# Patient Record
Sex: Female | Born: 1955 | Race: White | Hispanic: No | Marital: Married | Smoking: Former smoker
Health system: Southern US, Community
[De-identification: ages and names within clinical notes are randomized; demographics above are authoritative.]

## PROBLEM LIST (undated history)

## (undated) DIAGNOSIS — T7840XA Allergy, unspecified, initial encounter: Secondary | ICD-10-CM

## (undated) DIAGNOSIS — IMO0001 Reserved for inherently not codable concepts without codable children: Secondary | ICD-10-CM

## (undated) DIAGNOSIS — M810 Age-related osteoporosis without current pathological fracture: Secondary | ICD-10-CM

## (undated) DIAGNOSIS — K5790 Diverticulosis of intestine, part unspecified, without perforation or abscess without bleeding: Secondary | ICD-10-CM

## (undated) DIAGNOSIS — E2839 Other primary ovarian failure: Secondary | ICD-10-CM

## (undated) DIAGNOSIS — N3281 Overactive bladder: Secondary | ICD-10-CM

## (undated) DIAGNOSIS — M419 Scoliosis, unspecified: Secondary | ICD-10-CM

## (undated) DIAGNOSIS — R896 Abnormal cytological findings in specimens from other organs, systems and tissues: Secondary | ICD-10-CM

## (undated) DIAGNOSIS — K635 Polyp of colon: Secondary | ICD-10-CM

## (undated) DIAGNOSIS — R351 Nocturia: Secondary | ICD-10-CM

## (undated) DIAGNOSIS — M8580 Other specified disorders of bone density and structure, unspecified site: Secondary | ICD-10-CM

## (undated) DIAGNOSIS — I10 Essential (primary) hypertension: Secondary | ICD-10-CM

## (undated) HISTORY — DX: Other specified disorders of bone density and structure, unspecified site: M85.80

## (undated) HISTORY — DX: Other primary ovarian failure: E28.39

## (undated) HISTORY — DX: Overactive bladder: N32.81

## (undated) HISTORY — DX: Polyp of colon: K63.5

## (undated) HISTORY — DX: Nocturia: R35.1

## (undated) HISTORY — DX: Reserved for inherently not codable concepts without codable children: IMO0001

## (undated) HISTORY — DX: Diverticulosis of intestine, part unspecified, without perforation or abscess without bleeding: K57.90

## (undated) HISTORY — DX: Scoliosis, unspecified: M41.9

## (undated) HISTORY — PX: WISDOM TOOTH EXTRACTION: SHX21

## (undated) HISTORY — DX: Age-related osteoporosis without current pathological fracture: M81.0

## (undated) HISTORY — DX: Allergy, unspecified, initial encounter: T78.40XA

## (undated) HISTORY — DX: Abnormal cytological findings in specimens from other organs, systems and tissues: R89.6

## (undated) HISTORY — PX: COLONOSCOPY: SHX174

## (undated) HISTORY — DX: Essential (primary) hypertension: I10

---

## 1999-12-10 ENCOUNTER — Other Ambulatory Visit: Admission: RE | Admit: 1999-12-10 | Discharge: 1999-12-10 | Payer: Self-pay | Admitting: Gynecology

## 2000-04-12 ENCOUNTER — Emergency Department (HOSPITAL_COMMUNITY): Admission: EM | Admit: 2000-04-12 | Discharge: 2000-04-12 | Payer: Self-pay | Admitting: *Deleted

## 2000-04-24 ENCOUNTER — Emergency Department (HOSPITAL_COMMUNITY): Admission: EM | Admit: 2000-04-24 | Discharge: 2000-04-24 | Payer: Self-pay | Admitting: Emergency Medicine

## 2001-01-07 ENCOUNTER — Other Ambulatory Visit: Admission: RE | Admit: 2001-01-07 | Discharge: 2001-01-07 | Payer: Self-pay | Admitting: Internal Medicine

## 2002-02-12 ENCOUNTER — Other Ambulatory Visit: Admission: RE | Admit: 2002-02-12 | Discharge: 2002-02-12 | Payer: Self-pay | Admitting: Internal Medicine

## 2005-03-28 ENCOUNTER — Other Ambulatory Visit: Admission: RE | Admit: 2005-03-28 | Discharge: 2005-03-28 | Payer: Self-pay | Admitting: Internal Medicine

## 2005-06-21 ENCOUNTER — Ambulatory Visit: Payer: Self-pay | Admitting: Gastroenterology

## 2005-07-05 ENCOUNTER — Ambulatory Visit: Payer: Self-pay | Admitting: Gastroenterology

## 2005-09-13 ENCOUNTER — Ambulatory Visit (HOSPITAL_COMMUNITY): Admission: RE | Admit: 2005-09-13 | Discharge: 2005-09-13 | Payer: Self-pay | Admitting: Internal Medicine

## 2008-02-11 ENCOUNTER — Encounter: Admission: RE | Admit: 2008-02-11 | Discharge: 2008-02-11 | Payer: Self-pay | Admitting: Obstetrics and Gynecology

## 2010-06-13 ENCOUNTER — Encounter (INDEPENDENT_AMBULATORY_CARE_PROVIDER_SITE_OTHER): Payer: Self-pay | Admitting: *Deleted

## 2010-12-11 NOTE — Letter (Signed)
Summary: Colonoscopy Letter  [facility]  [address] [place], [place] [address]   Phone: [phone]  Fax: [phone]      June 13, 2010 MRN: [id number]   Tanya Kennedy [address] [place], [place]  [address]   Dear Ms. Hollan,   According to your medical record, it is time for you to schedule a Colonoscopy. The American Cancer Society recommends this procedure as a method to detect early colon cancer. Patients with a family history of colon cancer, or a personal history of colon polyps or inflammatory bowel disease are at increased risk.  This letter has beeen generated based on the recommendations made at the time of your procedure. If you feel that in your particular situation this may no longer apply, please contact our office.  Please call our office at [phone] to schedule this appointment or to update your records at your earliest convenience.  Thank you for cooperating with [place] to provide you with the very best care possible.   Sincerely,    Barbette Hair. [facility] [phone]

## 2012-03-10 ENCOUNTER — Telehealth: Payer: Self-pay | Admitting: Obstetrics and Gynecology

## 2012-03-10 NOTE — Telephone Encounter (Signed)
Tc to pt per telephone call. Told pt last pap smear 5/231/1-wnl. Due to new pap smear guidelines, next pap smear due 2014. Pt voices understanding.

## 2012-03-10 NOTE — Telephone Encounter (Signed)
Routed to chandra 

## 2012-04-16 ENCOUNTER — Ambulatory Visit: Payer: Self-pay | Admitting: Obstetrics and Gynecology

## 2012-04-17 ENCOUNTER — Other Ambulatory Visit: Payer: Self-pay

## 2012-04-20 ENCOUNTER — Encounter: Payer: Self-pay | Admitting: Obstetrics and Gynecology

## 2012-04-20 ENCOUNTER — Ambulatory Visit (INDEPENDENT_AMBULATORY_CARE_PROVIDER_SITE_OTHER): Payer: Commercial Managed Care - PPO | Admitting: Obstetrics and Gynecology

## 2012-04-20 ENCOUNTER — Other Ambulatory Visit (INDEPENDENT_AMBULATORY_CARE_PROVIDER_SITE_OTHER): Payer: Commercial Managed Care - PPO

## 2012-04-20 VITALS — BP 108/64 | Ht 64.0 in | Wt 133.0 lb

## 2012-04-20 DIAGNOSIS — M8580 Other specified disorders of bone density and structure, unspecified site: Secondary | ICD-10-CM

## 2012-04-20 DIAGNOSIS — R351 Nocturia: Secondary | ICD-10-CM | POA: Insufficient documentation

## 2012-04-20 DIAGNOSIS — Z0000 Encounter for general adult medical examination without abnormal findings: Secondary | ICD-10-CM

## 2012-04-20 DIAGNOSIS — N3281 Overactive bladder: Secondary | ICD-10-CM | POA: Insufficient documentation

## 2012-04-20 DIAGNOSIS — M899 Disorder of bone, unspecified: Secondary | ICD-10-CM

## 2012-04-20 DIAGNOSIS — M810 Age-related osteoporosis without current pathological fracture: Secondary | ICD-10-CM

## 2012-04-20 NOTE — Progress Notes (Signed)
Last Pap: 04/10/2010 WNL: Yes Regular Periods:yes Contraception: None   Monthly Breast exam:yes  Tetanus<95yrs:yes Nl.Bladder Function:yes Daily BMs:yes Healthy Diet:yes Calcium:yes Mammogram:yes 2012 Exercise:yes Seatbelt: yes Abuse at home: no Stressful work:no Sigmoid-colonoscopy: 2 years ago WNL Bone Density: Yes 04/10/2010  Subjective:    Tanya Kennedy is a 56 y.o. female G2P2 who presents for annual exam.  The patient has no complaints today.   The following portions of the patient's history were reviewed and updated as appropriate: allergies, current medications, past family history, past medical history, past social history, past surgical history and problem list.  Review of Systems Pertinent items are noted in HPI. Gastrointestinal:No change in bowel habits, no abdominal pain, no rectal bleeding Genitourinary:negative for dysuria, frequency, hematuria, nocturia and urinary incontinence    Objective:     BP 108/64  Ht 5\' 4"  (1.626 m)  Wt 133 lb (60.328 kg)  BMI 22.83 kg/m2  Weight:  Wt Readings from Last 1 Encounters:  04/20/12 133 lb (60.328 kg)     BMI: Body mass index is 22.83 kg/(m^2). General Appearance: Alert, appropriate appearance for age. No acute distress HEENT: Grossly normal Neck / Thyroid: Supple, no masses, nodes or enlargement Lungs: clear to auscultation bilaterally Back: No CVA tenderness Breast Exam: No masses or nodes.No dimpling, nipple retraction or discharge. Cardiovascular: Regular rate and rhythm. S1, S2, no murmur Gastrointestinal: Soft, non-tender, no masses or organomegaly Pelvic Exam: External genitalia: normal general appearance Vaginal: atrophic mucosa Cervix: normal appearance Adnexa: non palpable Uterus: normal single, nontender Rectovaginal: normal rectal, no masses Lymphatic Exam: Non-palpable nodes in neck, clavicular, axillary, or inguinal regions  Skin: no rash or abnormalities Neurologic: Normal gait and speech, no  tremor  Psychiatric: Alert and oriented, appropriate affect.    Urinalysis:Not done Bone density: Spine: T score -2.9    Femoral neck: T score -1.9    Total hip: T score -1.9    FRAX score:Maj. 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 Diagnosis explained in detail, including differential. Dietary diary. Discussed healthy lifestyle modifications. spine series to rule out occult spinal fracture  I discussed the possibility that medication would be advisable if indeed there is any evidence of occult fracture. Vitamin D level 30 minutes of weightbearing exercise at least 3 times per week Resistance training at least 2 times per week Followup based on x-ray results

## 2012-04-22 ENCOUNTER — Telehealth: Payer: Self-pay | Admitting: Obstetrics and Gynecology

## 2012-04-22 DIAGNOSIS — M810 Age-related osteoporosis without current pathological fracture: Secondary | ICD-10-CM

## 2012-04-22 NOTE — Telephone Encounter (Signed)
Chandra/received and addressed

## 2012-04-22 NOTE — Telephone Encounter (Signed)
Tc to pt per telephone call. Told pt Vit D results not back yet and will call once reviewed by vph. Pt voices understanding. Pt states,"was suppose to have xrays of spine sched per DEXA results". Told pt will consult with vph per recs. Pt agrees.

## 2012-04-23 LAB — VITAMIN D 1,25 DIHYDROXY: Vitamin D 1, 25 (OH)2 Total: 43 pg/mL (ref 18–72)

## 2012-04-23 NOTE — Telephone Encounter (Signed)
Consulted with vph, pt is to have xrays of the thoracic and lumbar spine to r/o occult fracture. Pt aware. Pt will have xrays done at earliest convienence.

## 2012-04-27 ENCOUNTER — Ambulatory Visit
Admission: RE | Admit: 2012-04-27 | Discharge: 2012-04-27 | Disposition: A | Discharge status: Home / Self Care | Payer: Commercial Managed Care - PPO | Source: Ambulatory Visit | Attending: Obstetrics and Gynecology | Admitting: Obstetrics and Gynecology

## 2012-04-27 ENCOUNTER — Telehealth: Payer: Self-pay

## 2012-04-27 DIAGNOSIS — M810 Age-related osteoporosis without current pathological fracture: Secondary | ICD-10-CM

## 2012-04-27 NOTE — Telephone Encounter (Signed)
Tc to pt per xray results. Told pt thoracic and lumbar spine xrays= no fracuture per ep. Told pt will follow up with vh when she returns to office on 05/07/12 rgdg POC. Pt voices understanding.

## 2012-04-29 ENCOUNTER — Other Ambulatory Visit: Payer: Self-pay

## 2012-05-05 ENCOUNTER — Telehealth: Payer: Self-pay

## 2012-05-05 NOTE — Telephone Encounter (Signed)
Tc to pt. Pt informed needs an appt to discuss options rgdg tx for osteoporosis. Pt states," at this time would prefer not to start any meds and would rather try natural remedies(ie increase exercise,etc). Pt wants vph to be aware of this before she schedules an appt. Informed pt it would be best to still sched an appt with vph to discuss concerns;however pt opts to consult with vph rgdg this before an appt is made. Will consult with vph per recs and call back. Pt agrees.

## 2012-05-08 NOTE — Telephone Encounter (Signed)
Lm on vm for pt to cb per vph recs rgdg tx for osteoporosis. Pt needs an appt to discuss options.

## 2012-05-18 ENCOUNTER — Telehealth: Payer: Self-pay | Admitting: Obstetrics and Gynecology

## 2012-05-18 NOTE — Telephone Encounter (Signed)
Chandra/call bck °

## 2012-05-20 NOTE — Telephone Encounter (Signed)
Lm on vm to cb per telephone call.  

## 2012-05-20 NOTE — Telephone Encounter (Signed)
Tc to pt per telephone call. Appt sched 06/09/12 @11 :00 with vph to discuss osteoporosis tx. Pt agrees.

## 2012-05-20 NOTE — Telephone Encounter (Signed)
Pt aware on vm to call office to sched an appt with vph to discuss osteoporosis tx.

## 2012-05-20 NOTE — Telephone Encounter (Signed)
Lm on vm to cb per recs rgdg osteoporosis.

## 2012-06-09 ENCOUNTER — Telehealth: Payer: Self-pay | Admitting: Obstetrics and Gynecology

## 2012-06-09 ENCOUNTER — Ambulatory Visit (INDEPENDENT_AMBULATORY_CARE_PROVIDER_SITE_OTHER): Payer: 59 | Admitting: Obstetrics and Gynecology

## 2012-06-09 ENCOUNTER — Encounter: Payer: Self-pay | Admitting: Obstetrics and Gynecology

## 2012-06-09 VITALS — BP 120/82 | Ht 64.0 in | Wt 131.5 lb

## 2012-06-09 DIAGNOSIS — M810 Age-related osteoporosis without current pathological fracture: Secondary | ICD-10-CM

## 2012-06-09 DIAGNOSIS — N951 Menopausal and female climacteric states: Secondary | ICD-10-CM | POA: Insufficient documentation

## 2012-06-09 DIAGNOSIS — G4700 Insomnia, unspecified: Secondary | ICD-10-CM | POA: Insufficient documentation

## 2012-06-09 NOTE — Progress Notes (Signed)
  GYN PROBLEM VISIT  [name] is a 56 y.o. year old female,G2P2, who presents for a problem visit. She has a history of osteoporosis of the spine with a most recent T score of -2.9 at the spine.  The hip T scores are in the osteopenia range.  She is asymptomatic with respect to any pain.  Spine X-rays show no occult fractures.  Subjective:Menopausal symptoms include urinary urgency and insomnia, primarily from early awakening.  Objective:  BP 120/82  Ht 5\' 4"  (1.626 m)  Wt 131 lb 8 oz (59.648 kg)  BMI 22.57 kg/m2   Bone density: Spine: T score -2.9  Femoral neck: T score -1.9  Total hip: T score -1.9  FRAX score:Maj. Osteoporotic 7.7%  Hip fracture 0.8%   Assessment: Osteoporosis of the spine without fracture and low risk of fracture in 10 years Menopausal symptoms with insomnia and urinary urgency Failed trial of Femring  Plan: Physical therapy consult for instruction on bone building exercise Estradiol patch starting at 0.025mg  twice weekly Micronized progesterone 100mg  hs Continue Calcium and vitamin D Return to office in 6 week(s).   Dierdre Forth, MD  06/09/2012 11:07 PM

## 2012-06-11 ENCOUNTER — Other Ambulatory Visit: Payer: Self-pay | Admitting: Obstetrics and Gynecology

## 2012-06-11 MED ORDER — PROGESTERONE MICRONIZED 100 MG PO CAPS: ORAL_CAPSULE | ORAL | Status: DC

## 2012-06-11 MED ORDER — ESTRADIOL 0.025 MG/24HR TD PTWK: 1.0000 | MEDICATED_PATCH | TRANSDERMAL | Status: DC

## 2012-06-11 NOTE — Telephone Encounter (Signed)
06/10/12- Tc to pt per telephone call. Consulted with vph, pt may begin HRT before mammogram scheduled in October. Rx for Micronized Progesterone and Estradiol patch 0.025mg  on file e-pres to pharm on file. Referral for PT faxed to [facility] [phone]. Pt voices understanding.

## 2012-06-11 NOTE — Telephone Encounter (Signed)
Addendum- Referral sent to [facility] due to [facility] not participating with pt's insurance

## 2012-06-11 NOTE — Telephone Encounter (Signed)
Tc to pharm. Insurance not covering 2x weekly Estradiol patch. Ok for pt to use once weekly per vph. Pharm agrees. Pt aware.

## 2012-06-11 NOTE — Telephone Encounter (Signed)
Chandra/pharm follow up

## 2012-06-11 NOTE — Telephone Encounter (Signed)
Tc from pt. Wants to know if a test called NTX(per pt) which uses urine to check bone strength should be considered for her in 6 months due to pt not using meds for osteoporosis tx. Will consult with vph and cb with recs. Pt agrees.

## 2012-06-18 ENCOUNTER — Telehealth: Payer: Self-pay

## 2012-06-22 NOTE — Telephone Encounter (Signed)
Returning pt's call. Lm on vm to cb.   

## 2012-06-22 NOTE — Telephone Encounter (Signed)
Lm on vm to cb per telephone call.  

## 2012-06-23 NOTE — Telephone Encounter (Signed)
Pt with several questions regarding medications given last visit. Questions answered. Pt voices understanding.

## 2012-06-29 ENCOUNTER — Ambulatory Visit: Payer: 59 | Admitting: Rehabilitative and Restorative Service Providers"

## 2012-07-06 ENCOUNTER — Ambulatory Visit
Payer: Commercial Managed Care - PPO | Attending: Obstetrics and Gynecology | Admitting: Rehabilitative and Restorative Service Providers"

## 2012-07-06 DIAGNOSIS — IMO0001 Reserved for inherently not codable concepts without codable children: Secondary | ICD-10-CM | POA: Insufficient documentation

## 2012-07-06 DIAGNOSIS — M6281 Muscle weakness (generalized): Secondary | ICD-10-CM | POA: Insufficient documentation

## 2012-07-06 DIAGNOSIS — M810 Age-related osteoporosis without current pathological fracture: Secondary | ICD-10-CM | POA: Insufficient documentation

## 2012-07-14 ENCOUNTER — Encounter: Payer: Self-pay | Admitting: Gastroenterology

## 2012-07-20 ENCOUNTER — Ambulatory Visit
Payer: Commercial Managed Care - PPO | Attending: Internal Medicine | Admitting: Rehabilitative and Restorative Service Providers"

## 2012-07-20 DIAGNOSIS — M810 Age-related osteoporosis without current pathological fracture: Secondary | ICD-10-CM | POA: Insufficient documentation

## 2012-07-20 DIAGNOSIS — M6281 Muscle weakness (generalized): Secondary | ICD-10-CM | POA: Insufficient documentation

## 2012-07-20 DIAGNOSIS — IMO0001 Reserved for inherently not codable concepts without codable children: Secondary | ICD-10-CM | POA: Insufficient documentation

## 2012-07-24 DIAGNOSIS — IMO0001 Reserved for inherently not codable concepts without codable children: Secondary | ICD-10-CM | POA: Insufficient documentation

## 2012-07-24 DIAGNOSIS — E2839 Other primary ovarian failure: Secondary | ICD-10-CM | POA: Insufficient documentation

## 2012-07-24 DIAGNOSIS — M419 Scoliosis, unspecified: Secondary | ICD-10-CM | POA: Insufficient documentation

## 2012-07-24 DIAGNOSIS — M810 Age-related osteoporosis without current pathological fracture: Secondary | ICD-10-CM | POA: Insufficient documentation

## 2012-07-24 DIAGNOSIS — M8580 Other specified disorders of bone density and structure, unspecified site: Secondary | ICD-10-CM | POA: Insufficient documentation

## 2012-07-24 DIAGNOSIS — I10 Essential (primary) hypertension: Secondary | ICD-10-CM | POA: Insufficient documentation

## 2012-07-27 ENCOUNTER — Ambulatory Visit: Payer: Commercial Managed Care - PPO | Admitting: Rehabilitative and Restorative Service Providers"

## 2012-07-29 ENCOUNTER — Encounter: Payer: Commercial Managed Care - PPO | Admitting: Obstetrics and Gynecology

## 2012-08-11 ENCOUNTER — Encounter: Payer: Commercial Managed Care - PPO | Admitting: Rehabilitative and Restorative Service Providers"

## 2012-08-25 ENCOUNTER — Telehealth: Payer: Self-pay | Admitting: Obstetrics and Gynecology

## 2012-08-25 NOTE — Telephone Encounter (Signed)
Returned pt's call. LM to return call.   

## 2012-08-25 NOTE — Telephone Encounter (Signed)
TC from pt. Questioning if needs to keep F/U appt with [facility] if med is working well. Per CW, Dr VPH recommended appt in 6 weeks after beginning new meds 05/2012. Advised to keep appt if possible unless is unable to due to finances. Pt agreeable.

## 2012-09-07 ENCOUNTER — Ambulatory Visit (INDEPENDENT_AMBULATORY_CARE_PROVIDER_SITE_OTHER): Payer: Commercial Managed Care - PPO | Admitting: Obstetrics and Gynecology

## 2012-09-07 ENCOUNTER — Encounter: Payer: Self-pay | Admitting: Obstetrics and Gynecology

## 2012-09-07 VITALS — BP 116/68 | Ht 64.0 in | Wt 129.0 lb

## 2012-09-07 DIAGNOSIS — N951 Menopausal and female climacteric states: Secondary | ICD-10-CM

## 2012-09-07 DIAGNOSIS — E2839 Other primary ovarian failure: Secondary | ICD-10-CM

## 2012-09-07 DIAGNOSIS — M810 Age-related osteoporosis without current pathological fracture: Secondary | ICD-10-CM

## 2012-09-07 MED ORDER — ESTRADIOL 0.0375 MG/24HR TD PTTW: 1.0000 | MEDICATED_PATCH | TRANSDERMAL | Status: DC

## 2012-09-07 NOTE — Progress Notes (Signed)
FOLLOW UP:  GYN PROBLEM VISIT  [name] is a 56 y.o. year old female,G2P2, who presents for a problem visit.   Subjective:  Pt here to f/u from visit in July 2013 for osteoporosis. She was started on Climara 0.025 mg once weekly and Prometrium 100 mg at bedtime.  She is tolerating this regimen well.  She denies breast tenderness.  She had seen a physical therapist for exercises aimed at osteoporosis.  Insomnia he is a long-standing problem for her, and is still a problem along with long standing nocturia.  She has used a Femring in the past but did not tolerate it.  Objective:  BP 116/68  Ht 5\' 4"  (1.626 m)  Wt 129 lb (58.514 kg)  BMI 22.14 kg/m2  LMP 11/11/1996    Assessment: Osteoporosis  Chronic insomnia Nocturia   Plan: Increased Climara 0.0375mg  Continue Prometrium 100 mg at bedtime Continued exercise Risks and benefits of THERAPIES reviewed Return to office in 3 month(s).   Dierdre Forth, MD  09/07/2012 3:27 PM

## 2012-09-09 ENCOUNTER — Telehealth: Payer: Self-pay | Admitting: Obstetrics and Gynecology

## 2012-09-09 NOTE — Telephone Encounter (Signed)
VM from [facility].  Pt given RX for Vivelle 0.375.  Wants Vivelle Dot as is cheaper.  Phone [phone]

## 2012-09-09 NOTE — Telephone Encounter (Signed)
Message  Sent to Dr [facility].

## 2012-09-10 ENCOUNTER — Telehealth: Payer: Self-pay | Admitting: Obstetrics and Gynecology

## 2012-09-10 NOTE — Telephone Encounter (Signed)
TC to [place] [facility] at [facility].  Per Dr VPH OK to change to Minivelle 0.375 mg.

## 2012-09-10 NOTE — Telephone Encounter (Signed)
Message copied by Mason Jim on Thu Sep 10, 2012 10:37 AM ------      Message from: Dierdre Forth P      Created: Wed Sep 09, 2012  7:41 PM       Absolutely fine.            ----- Message -----         From: Constance Haw, RN         Sent: 09/09/2012  11:19 AM           To: Hal Morales, MD            [facility] phamacy called. Pt wants to change from Vivelle Dot to MiniVivelle same dosage of 0.375 mg due to lower cost.  Is this OK?   Thanks Harriett Sine

## 2012-10-21 ENCOUNTER — Encounter: Payer: Self-pay | Admitting: Obstetrics and Gynecology

## 2012-10-21 DIAGNOSIS — R922 Inconclusive mammogram: Secondary | ICD-10-CM | POA: Insufficient documentation

## 2012-10-30 ENCOUNTER — Ambulatory Visit (INDEPENDENT_AMBULATORY_CARE_PROVIDER_SITE_OTHER): Payer: Commercial Managed Care - PPO | Admitting: Family Medicine

## 2012-10-30 VITALS — BP 126/70 | HR 96 | Temp 98.0°F | Resp 18 | Ht 64.0 in | Wt 126.8 lb

## 2012-10-30 DIAGNOSIS — S61209A Unspecified open wound of unspecified finger without damage to nail, initial encounter: Secondary | ICD-10-CM

## 2012-10-30 DIAGNOSIS — M79609 Pain in unspecified limb: Secondary | ICD-10-CM

## 2012-10-30 NOTE — Progress Notes (Signed)
This is a 56 year old woman who cut her left thumb on some stained-glass this morning. 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 She has very little pain at the present time and the bleeding stopped.  Objective: No acute distress The radial edge of the right distal thumb has a three-quarter centimeter linear laceration with skin flap. The area was cleansed with Betadine antibiotic and then closed with Dermabond. Patient had full range of motion and normal sensation.  Assessment: Simple superficial laceration with skin flap repaired with Dermabond

## 2012-12-14 ENCOUNTER — Telehealth: Payer: Self-pay | Admitting: Obstetrics and Gynecology

## 2012-12-14 NOTE — Telephone Encounter (Signed)
Pt calling to see why she is needing a 3 month f/u. Advised pt per last note by [facility] 09/07/2012 Climara dosage was increased and that is the reason she is needing the f/u. Pt states that she is wanting to advise VPH that rx is working fine. Due to financial issues she is not wanting to come in for f/u. Pt does have concerns regarding progesterone 100 mg tab daily. Pt wondering if there is a patch that contains estrogen & progesterone that may be cheaper for her.

## 2012-12-15 NOTE — Telephone Encounter (Signed)
Climara Pro 1 patch weekly #4 with refills until aex will give her estrogen at a slightly higher dose than she is taking now and progesterone in the same patich.  We can prescribe for her if she prefers.  The patch is once weekly.

## 2012-12-16 NOTE — Telephone Encounter (Signed)
Can someone in triage please advise pt

## 2012-12-16 NOTE — Telephone Encounter (Signed)
Lm on vm to cb per [facility] recs. +

## 2012-12-16 NOTE — Telephone Encounter (Signed)
VPH pt 

## 2012-12-17 NOTE — Telephone Encounter (Signed)
Tc from pt. Informed pt of [facility] recs to try Climara Pro if desires. Pt will call pharm to check prices and cb if decides to begin meds. Pt agrees.

## 2014-02-07 IMAGING — CR DG LUMBAR SPINE COMPLETE 4+V
5 series · 5 of 5 positions shown · non-contrast
Comparison: 02/11/2008

CLINICAL DATA: T-spine and lumbar spine pain.  Osteoporosis
diagnosed on bone density exam.  No known injury.

LUMBAR SPINE - COMPLETE 4+ VIEW

[view not recorded (1 of 5)]
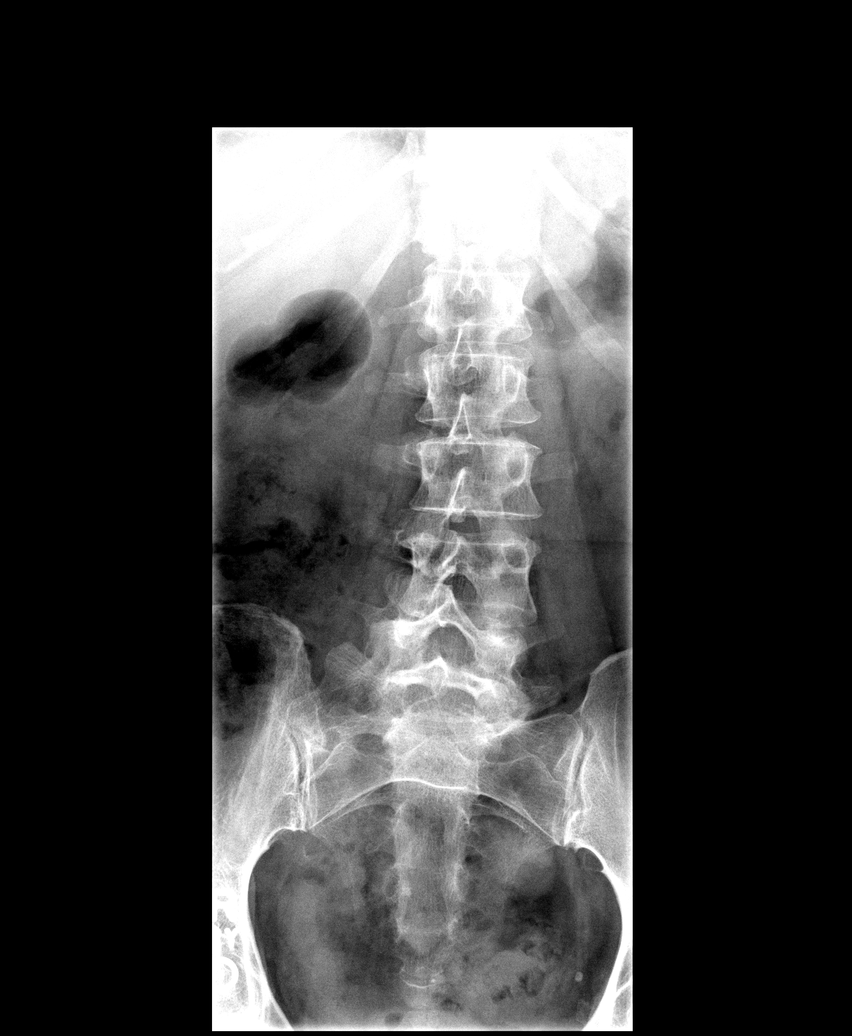

[view not recorded (2 of 5)]
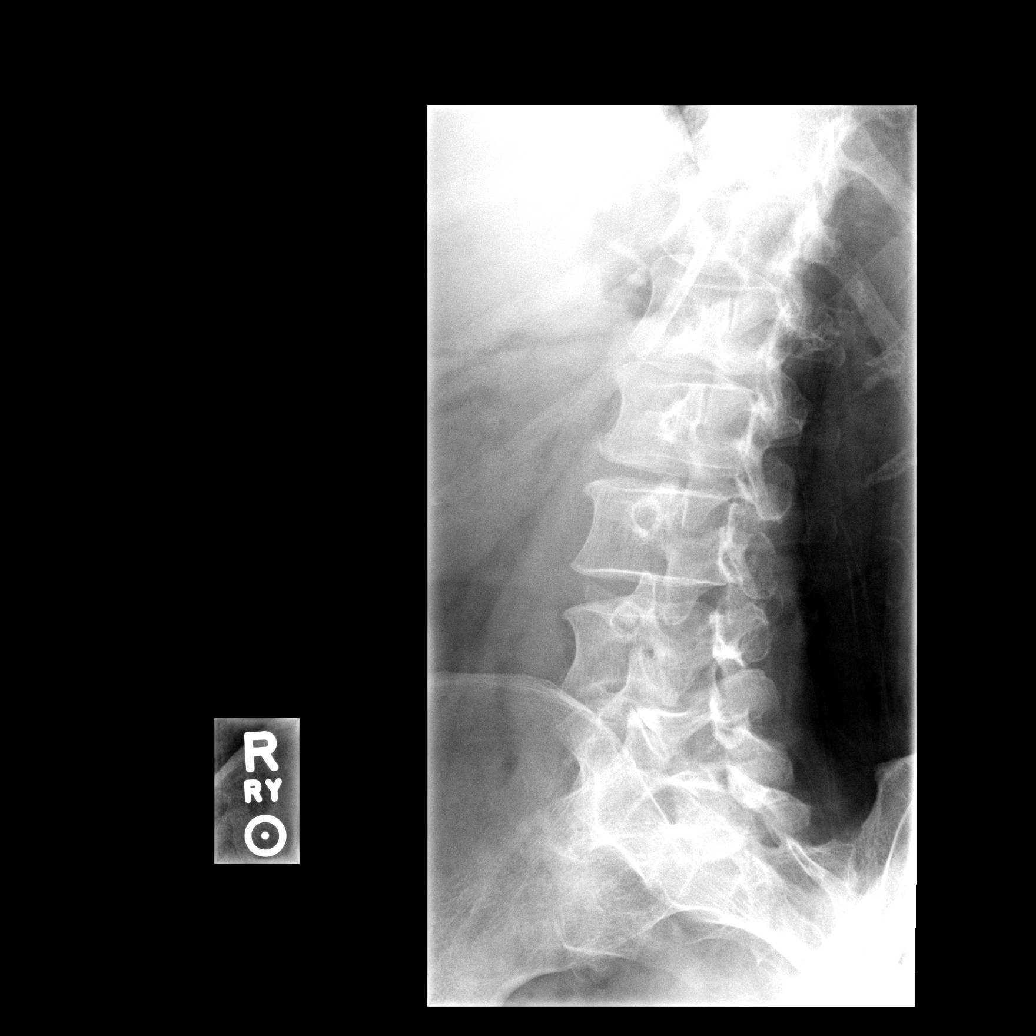

[view not recorded (3 of 5)]
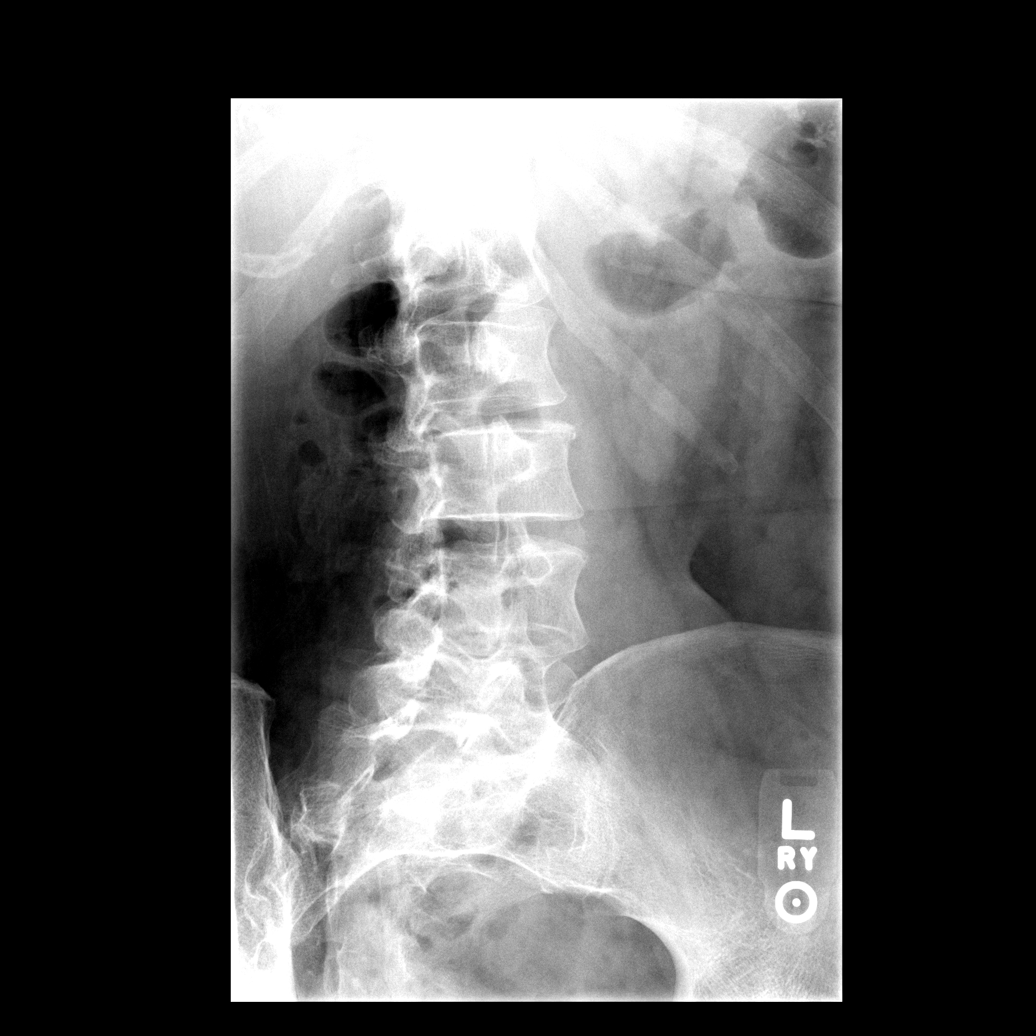

[view not recorded (4 of 5)]
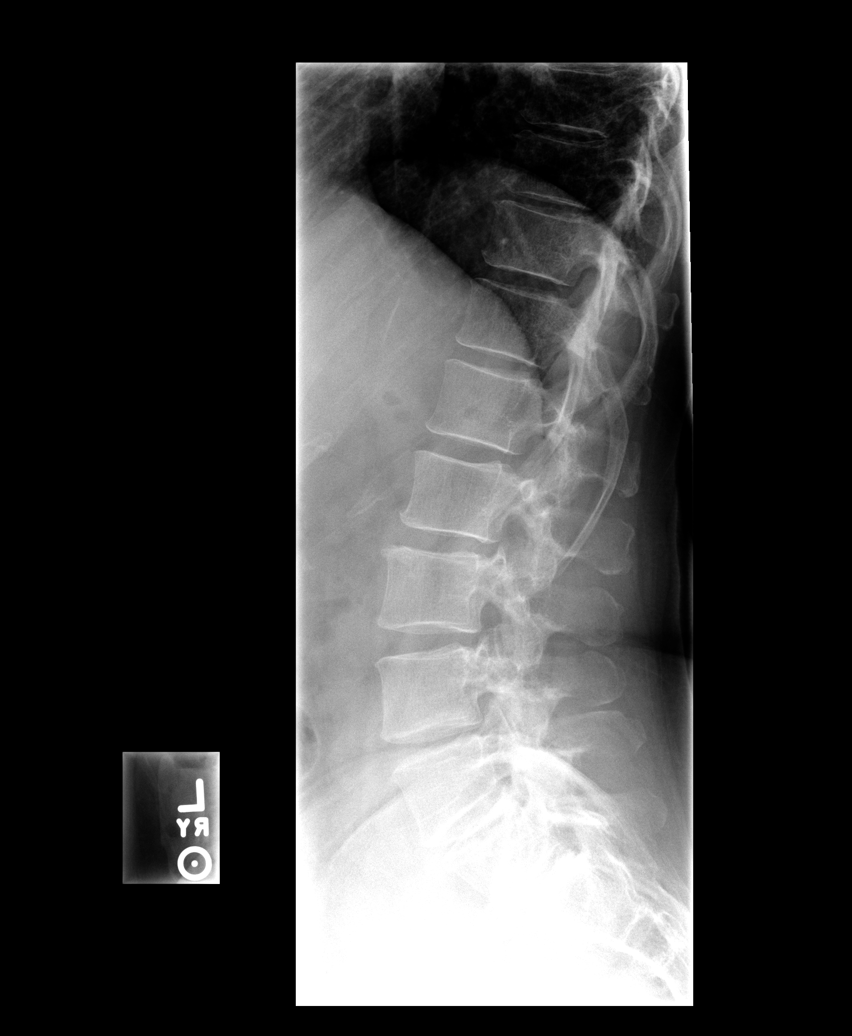

[view not recorded (5 of 5)]
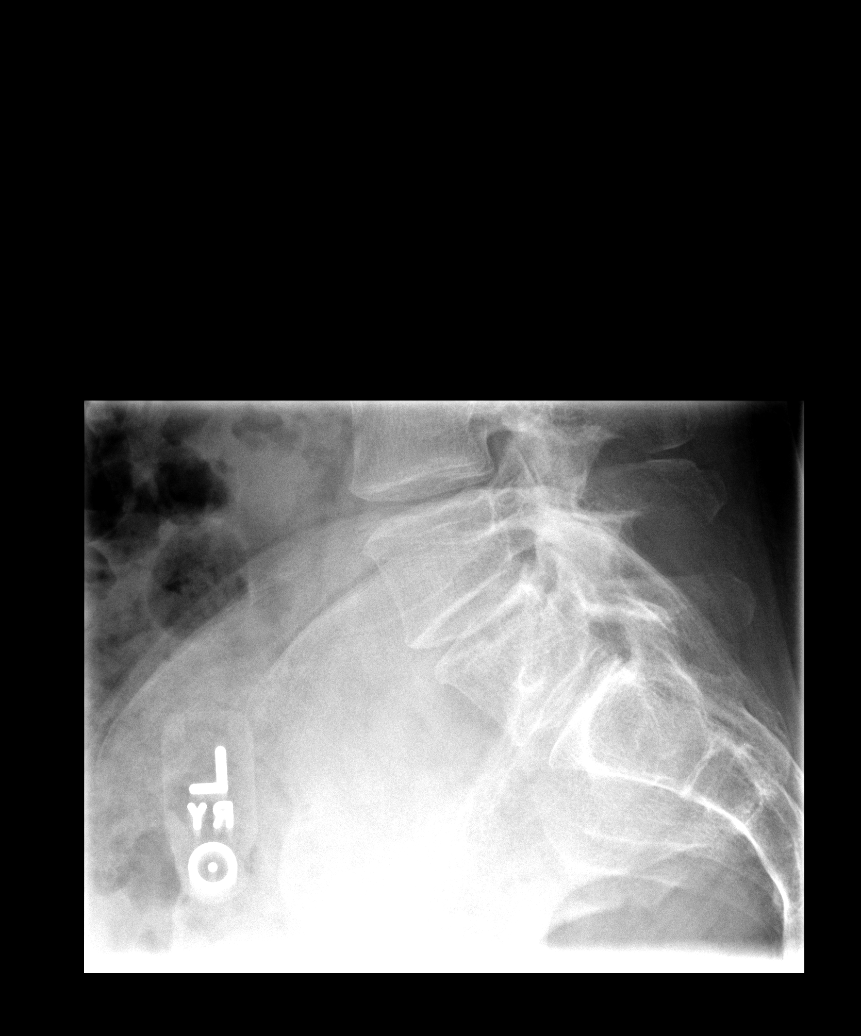

[5 of 5 positions shown; findings below may reference images not displayed]

FINDINGS: There is a transitional anatomy at the lumbosacral
junction.  No evidence for acute fracture or subluxation.  Mild
degenerative changes are present.  Normal alignment.  No evidence
for spondylolisthesis or spondylolysis.  Regional bowel gas pattern
is nonobstructive.
IMPRESSION: No evidence for acute  abnormality.

## 2015-09-29 ENCOUNTER — Ambulatory Visit (INDEPENDENT_AMBULATORY_CARE_PROVIDER_SITE_OTHER): Payer: Commercial Managed Care - PPO

## 2015-09-29 ENCOUNTER — Encounter: Payer: Self-pay | Admitting: Podiatry

## 2015-09-29 ENCOUNTER — Ambulatory Visit (INDEPENDENT_AMBULATORY_CARE_PROVIDER_SITE_OTHER): Payer: Commercial Managed Care - PPO | Admitting: Podiatry

## 2015-09-29 VITALS — BP 116/79 | HR 78 | Resp 16 | Ht 64.0 in | Wt 135.0 lb

## 2015-09-29 DIAGNOSIS — M722 Plantar fascial fibromatosis: Secondary | ICD-10-CM

## 2015-09-29 MED ORDER — TRIAMCINOLONE ACETONIDE 10 MG/ML IJ SUSP
10.0000 mg | Freq: Once | INTRAMUSCULAR | Status: AC
  Administered 2015-09-29: 10 mg

## 2015-09-29 MED ORDER — MELOXICAM 15 MG PO TABS: 15.0000 mg | ORAL_TABLET | Freq: Every day | ORAL | Status: DC

## 2015-09-29 NOTE — Progress Notes (Signed)
   Subjective:    Patient ID: Tanya Kennedy, female    DOB: [birth year], 59 y.o.   MRN: [id number]  HPI  Patient presents with foot pain in their left foot, heel. This has been going on for the past 2-3 months.  Review of Systems  Respiratory: Positive for cough.   All other systems reviewed and are negative.      Objective:   Physical Exam        Assessment & Plan:

## 2015-09-29 NOTE — Patient Instructions (Signed)

## 2015-09-30 NOTE — Progress Notes (Signed)
Subjective:     Patient ID: Tanya MoseSusan D Kennedy, female   DOB: 1956-05-04, 59 y.o.   MRN: [id number]  HPI patient states I'm having a lot of pain in the bottom of my left heel that's been present for around 3 months. I like to be active in running and I have not been able to do any of these types of activity due to the pain   Review of Systems  All other systems reviewed and are negative.      Objective:   Physical Exam  Constitutional: She is oriented to person, place, and time.  Cardiovascular: Intact distal pulses.   Musculoskeletal: Normal range of motion.  Neurological: She is oriented to person, place, and time.  Skin: Skin is warm.  Nursing note and vitals reviewed.  neurovascular status found to be intact muscle strength was adequate range of motion within normal limits with moderate depression of the arch noted. Patient's noted to have inflammation and pain at the medial band of the calcaneus fascial insertion with fluid buildup noted upon palpation and is also noted to have again mechanical dysfunction of the arch     Assessment:     Acute plantar fasciitis left with mechanical dysfunction of the arch noted    Plan:     H&P x-rays reviewed and condition discussed. Injected the plantar fascia at its insertion 3 mg Kenalog 5 mg Xylocaine and applied fascial brace with instructions on usage. Discussed long-term activity expectations and went ahead today and discussed long-term orthotic therapy and shoe gear modification

## 2015-10-02 ENCOUNTER — Telehealth: Payer: Self-pay | Admitting: *Deleted

## 2015-10-02 NOTE — Telephone Encounter (Signed)
Pt states she has lost her plantar fascial brace. I told pt she could pickup another, and we could file with insurance but it may not cover, and she would need to pay the balance.  Pt states she will continue to look for it and call again if needed.

## 2015-10-13 ENCOUNTER — Ambulatory Visit: Payer: Commercial Managed Care - PPO | Admitting: Podiatry

## 2015-10-16 ENCOUNTER — Encounter: Payer: Self-pay | Admitting: Podiatry

## 2015-10-16 ENCOUNTER — Ambulatory Visit (INDEPENDENT_AMBULATORY_CARE_PROVIDER_SITE_OTHER): Payer: Commercial Managed Care - PPO | Admitting: Podiatry

## 2015-10-16 DIAGNOSIS — M722 Plantar fascial fibromatosis: Secondary | ICD-10-CM | POA: Diagnosis not present

## 2015-10-17 NOTE — Progress Notes (Signed)
Subjective:     Patient ID: Tanya MoseSusan D Kennedy, female   DOB: 1955-12-12, 59 y.o.   MRN: [id number]  HPI patient states her feet feel quite a bit better but she no she needs long-term support   Review of Systems     Objective:   Physical Exam Neurovascular status unchanged with discomfort which has improved quite a bit with mild discomfort with deep palpation    Assessment:     Improving from fasciitis-like symptoms with continued depression of the arch noted    Plan:     Advised on new orthotics and scanned for customized orthotics to reduce stress against the feet and reappoint when returned

## 2015-11-07 ENCOUNTER — Ambulatory Visit: Payer: Commercial Managed Care - PPO | Admitting: *Deleted

## 2015-11-07 DIAGNOSIS — M722 Plantar fascial fibromatosis: Secondary | ICD-10-CM

## 2015-11-07 NOTE — Patient Instructions (Signed)

## 2015-11-07 NOTE — Progress Notes (Signed)
Patient ID: [name], female   DOB: 1956-05-09, 59 y.o.   MRN: [id number] Patient presents for orthotic pick up.  Verbal and written break in and wear instructions given.  Patient will follow up in 4 weeks if symptoms worsen or fail to improve.

## 2015-11-14 ENCOUNTER — Telehealth: Payer: Self-pay | Admitting: *Deleted

## 2015-11-14 NOTE — Telephone Encounter (Addendum)
Pt states she has a billing question, and had spoken with the billing agent, and was told to speak with the nurse or the office manager.  I asked pt if I could help and she said she didn't know why she had the orthotic and what they were for, and she didn't want them.  I explained they were made in a fashion to support the structures of her feet while walking or standing, that they were made by placing her feet and ankles in good position as if she were standing, without her weight which may put her feet in poor positioning.  Pt states she did not care, she was never explained to what they were or were for, and could only wear in her athletic shoes and only 4 hours per week.  Pt states she wanted to speak with the office manager and she wanted her money back.  11/15/2015 - Informed Ms Bonkemeyer of Dr. Beverlee Nimsegal's orders.

## 2015-11-15 NOTE — Telephone Encounter (Signed)
That's fine. Give her money back

## 2015-11-22 ENCOUNTER — Telehealth: Payer: Self-pay

## 2015-11-22 NOTE — Telephone Encounter (Signed)
[facility] pages to Historical Provider

## 2015-12-01 ENCOUNTER — Telehealth: Payer: Self-pay | Admitting: Gastroenterology

## 2015-12-01 NOTE — Telephone Encounter (Signed)
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 Received records from [facility]. She states that she is due for next colon. She is coming back to our office because Dr. Kinnie Scales does not take her insurance anymore. Patient is requesting Dr. Adela Lank. Records placed on Dr. Lanetta Inch desk for review.

## 2015-12-12 ENCOUNTER — Encounter: Payer: Self-pay | Admitting: Gastroenterology

## 2015-12-12 NOTE — Telephone Encounter (Signed)
Dr. Armbruster reviewed records and has accepted patient. Ok to schedule OV. Appointment scheduled. ° °

## 2016-01-05 ENCOUNTER — Encounter: Payer: Self-pay | Admitting: Gastroenterology

## 2016-02-02 ENCOUNTER — Ambulatory Visit: Payer: Commercial Managed Care - PPO | Admitting: Gastroenterology

## 2016-02-28 ENCOUNTER — Ambulatory Visit (INDEPENDENT_AMBULATORY_CARE_PROVIDER_SITE_OTHER): Payer: Managed Care, Other (non HMO) | Admitting: Gastroenterology

## 2016-02-28 ENCOUNTER — Encounter: Payer: Self-pay | Admitting: Gastroenterology

## 2016-02-28 VITALS — BP 118/78 | HR 80 | Ht 63.25 in | Wt 135.4 lb

## 2016-02-28 DIAGNOSIS — Z1211 Encounter for screening for malignant neoplasm of colon: Secondary | ICD-10-CM | POA: Diagnosis not present

## 2016-02-28 DIAGNOSIS — Z800 Family history of malignant neoplasm of digestive organs: Secondary | ICD-10-CM

## 2016-02-28 NOTE — Progress Notes (Signed)
HPI :  60 y/o female, seen in consultation for Dr. Chilton Si for a new patient evaluation to discuss colon cancer screening in light of her family history of colon cancer.   She reports feeling well today, denies bowel habit changes. No constipation or diarrhea. NO blood in the stools. No abdominal pains. No weight loss. No history of anemia.  She denies any complaints, feeling well.   Father had colon cancer. He was diagnosed early 19s. No other family members with colon cancer she is aware of.  Colonoscopy 11/08/10 by Dr. Kinnie Scales - sigmoid diverticulosis, internal hemorrhoids, otherwise normal without colon polyps, good bowel prep noted. Colonoscopy 07/05/2005 by Dr. Arlyce Dice, signoid diverticulosis, otherwise normal, no polyps   Past Medical History  Diagnosis Date  . Osteoporosis   . Osteopenia   . Hypoestrogenism   . Nocturia   . HBP (high blood pressure)   . Scoliosis   . OAB (overactive bladder)     h/o  . Abnormal finding on Pap smear, ASCUS   . Diverticulosis      Past Surgical History  Procedure Laterality Date  . Wisdom tooth extraction    . Colonoscopy     Family History  Problem Relation Age of Onset  . Colon cancer Father   . Hypertension Mother   . Cancer Mother    Social History  Substance Use Topics  . Smoking status: Never Smoker   . Smokeless tobacco: Never Used  . Alcohol Use: Yes   Current Outpatient Prescriptions  Medication Sig Dispense Refill  . calcium carbonate 200 MG capsule Take 250 mg by mouth 2 (two) times daily with a meal.    . citalopram (CELEXA) 20 MG tablet Take 20 mg by mouth daily.     . clonazePAM (KLONOPIN) 0.5 MG tablet Take 0.5 mg by mouth as needed. Pt takes 1/2 tablet as needed for sleep    . lisinopril-hydrochlorothiazide (PRINZIDE,ZESTORETIC) 10-12.5 MG per tablet Take 1 tablet by mouth daily.    . Multiple Vitamin (MULTIVITAMIN) tablet Take 1 tablet by mouth daily.    Marland Kitchen estradiol (CLIMARA - DOSED IN MG/24 HR) 0.025 mg/24hr  Place 1 patch (0.025 mg total) onto the skin 2 (two) times a week. 8 patch 12   No current facility-administered medications for this visit.   No Known Allergies   Review of Systems: All systems reviewed and negative except where noted in HPI.    No results found.  Physical Exam: Ht 5' 3.25" (1.607 m)  Wt 135 lb 6 oz (61.406 kg)  BMI 23.78 kg/m2  LMP 11/11/1996 Constitutional: Pleasant,well-developed, female in no acute distress. HEENT: Normocephalic and atraumatic. Conjunctivae are normal. No scleral icterus.  Cardiovascular: Normal rate, regular rhythm.  Pulmonary/chest: Effort normal and breath sounds normal. No wheezing, rales or rhonchi. Abdominal: Soft, nondistended, nontender. Bowel sounds active throughout. There are no masses palpable. No hepatomegaly. Extremities: no edema Neurological: Alert and oriented to person place and time. Skin: Skin is warm and dry. No rashes noted. Psychiatric: Normal mood and affect. Behavior is normal.   ASSESSMENT AND PLAN: 60 y/o female here to discuss CRC screening. She is asymptomatic and denies a history of anemia. Her father had colon cancer at age 35s. Her last 2 colonoscopies in 2011 and in 2006 were normal without polyps. Given her last exam was normal, per the ACG guidelines on CRC screening, she is not due for repeat colon cancer screening until 2021 (10 years from her last exam) and she should be  treated as an average risk patient, as her father was diagnosed with colon cancer at age > 60 years. That being said, if she is anxious about this and strongly wished to have a colonoscopy, I offered it to her. I discussed the risks / benefits of optical colonoscopy with her. Following our discussion, she was comfortable following the guidelines on this issue and was okay to wait until 2021. We will place a recall for her to have the exam done at that time. She agreed and will contact us in the interim with questions / concerns or development  of new symptoms.   Ileene PatrickSteven Shermeka Rutt, MD [facility] Pager [phone]  CC: Nila NephewGreen, Edwin, MD

## 2016-02-28 NOTE — Patient Instructions (Signed)
  We are putting you in for a colon recall for December 2021.    I appreciate the opportunity to care for you.

## 2017-03-27 ENCOUNTER — Ambulatory Visit (INDEPENDENT_AMBULATORY_CARE_PROVIDER_SITE_OTHER): Payer: Managed Care, Other (non HMO)

## 2017-03-27 ENCOUNTER — Encounter: Payer: Self-pay | Admitting: Emergency Medicine

## 2017-03-27 ENCOUNTER — Ambulatory Visit (INDEPENDENT_AMBULATORY_CARE_PROVIDER_SITE_OTHER): Payer: Managed Care, Other (non HMO) | Admitting: Emergency Medicine

## 2017-03-27 VITALS — BP 125/82 | HR 85 | Temp 97.8°F | Resp 17 | Ht 63.6 in | Wt 134.0 lb

## 2017-03-27 DIAGNOSIS — R1319 Other dysphagia: Secondary | ICD-10-CM

## 2017-03-27 DIAGNOSIS — R05 Cough: Secondary | ICD-10-CM | POA: Diagnosis not present

## 2017-03-27 DIAGNOSIS — J069 Acute upper respiratory infection, unspecified: Secondary | ICD-10-CM | POA: Diagnosis not present

## 2017-03-27 DIAGNOSIS — R1310 Dysphagia, unspecified: Secondary | ICD-10-CM | POA: Diagnosis not present

## 2017-03-27 DIAGNOSIS — J029 Acute pharyngitis, unspecified: Secondary | ICD-10-CM | POA: Diagnosis not present

## 2017-03-27 DIAGNOSIS — R059 Cough, unspecified: Secondary | ICD-10-CM

## 2017-03-27 MED ORDER — CEFUROXIME AXETIL 500 MG PO TABS: 500.0000 mg | ORAL_TABLET | Freq: Two times a day (BID) | ORAL | 0 refills | Status: AC

## 2017-03-27 NOTE — Progress Notes (Signed)
Tanya Kennedy 61 y.o.   Chief Complaint  Patient presents with  . Sore Throat    onset 2 weeks  . Cough    HISTORY OF PRESENT ILLNESS: This is a 61 y.o. female complaining of URI symptoms x 2 weeks; started out as a cold but now getting worse; c/o sore throat and non-productive cough. Recently had a physical and was told everything was normal. Had blood work several weeks ago. She has ben having trouble swallowing solids progressively getting worse the last 1-2 years; gave up meat because she can't swallow it. Denies significant weight loss. Prilosec did not help or made a difference.  HPI   Prior to Admission medications   Medication Sig Start Date End Date Taking? Authorizing Provider  calcium carbonate 200 MG capsule Take 250 mg by mouth 2 (two) times daily with a meal.   Yes [provider]  citalopram (CELEXA) 20 MG tablet Take 20 mg by mouth daily.  08/04/15  Yes [provider]  clonazePAM (KLONOPIN) 0.5 MG tablet Take 0.5 mg by mouth as needed. Pt takes 1/2 tablet as needed for sleep   Yes [provider]  lisinopril-hydrochlorothiazide (PRINZIDE,ZESTORETIC) 10-12.5 MG per tablet Take 1 tablet by mouth daily.   Yes [provider]  Multiple Vitamin (MULTIVITAMIN) tablet Take 1 tablet by mouth daily.   Yes [provider]    No Known Allergies  Patient Active Problem List   Diagnosis Date Noted  . Dense breasts 10/21/2012  . Osteoporosis   . Osteopenia   . Hypoestrogenism   . HBP (high blood pressure)   . Scoliosis   . Abnormal finding on Pap smear, ASCUS   . Insomnia 06/09/2012  . Menopausal symptoms 06/09/2012  . OAB (overactive bladder) 04/20/2012  . Nocturia 04/20/2012    Past Medical History:  Diagnosis Date  . Abnormal finding on Pap smear, ASCUS   . Allergy   . Colon polyps   . Diverticulosis   . HBP (high blood pressure)   . Hypoestrogenism   . Nocturia   . OAB (overactive bladder)    h/o  . Osteopenia     . Osteoporosis   . Scoliosis     Past Surgical History:  Procedure Laterality Date  . COLONOSCOPY    . WISDOM TOOTH EXTRACTION      Social History   Social History  . Marital status: Married    Spouse name: N/A  . Number of children: 2  . Years of education: N/A   Occupational History  . [employer]    Social History Main Topics  . Smoking status: Former Smoker    Types: Cigarettes    Quit date: 11/11/1981  . Smokeless tobacco: Never Used  . Alcohol use 0.0 oz/week     Comment: social  . Drug use: No  . Sexual activity: Yes    Birth control/ protection: Post-menopausal   Other Topics Concern  . Not on file   Social History Narrative  . No narrative on file    Family History  Problem Relation Age of Onset  . Colon cancer Father   . Hypertension Mother   . Cancer Mother        type unknown-mets     Review of Systems  Constitutional: Negative.  Negative for chills and fever.  HENT: Positive for sore throat.   Eyes: Negative.   Respiratory: Positive for cough. Negative for shortness of breath.   Cardiovascular: Negative.  Negative for chest  pain, palpitations, claudication and leg swelling.  Gastrointestinal: Negative for abdominal pain, blood in stool, heartburn, melena, nausea and vomiting.       Trouble swallowing solids; gave up meat because of this. 1-2 years  Genitourinary: Negative.  Negative for dysuria and hematuria.  Musculoskeletal: Negative.  Negative for myalgias and neck pain.  Skin: Negative.  Negative for rash.  Neurological: Negative for dizziness, sensory change, focal weakness and headaches.  Endo/Heme/Allergies: Negative.   All other systems reviewed and are negative.  Vitals:   03/27/17 1103  BP: 125/82  Pulse: 85  Resp: 17  Temp: 97.8 F (36.6 C)     Physical Exam  Constitutional: She is oriented to person, place, and time. She appears well-developed and well-nourished.  HENT:  Head: Normocephalic and  atraumatic.  Nose: Nose normal.  Mouth/Throat: Uvula is midline. Posterior oropharyngeal erythema present. No oropharyngeal exudate.  Eyes: Conjunctivae and EOM are normal. Pupils are equal, round, and reactive to light.  Neck: Normal range of motion. Neck supple. No JVD present. No thyromegaly present.  Cardiovascular: Normal rate, regular rhythm and normal heart sounds.   Pulmonary/Chest: Effort normal and breath sounds normal.  Abdominal: Soft. Bowel sounds are normal. She exhibits no distension. There is no tenderness.  Musculoskeletal: Normal range of motion.  Lymphadenopathy:    She has no cervical adenopathy.  Neurological: She is alert and oriented to person, place, and time. No sensory deficit. She exhibits normal muscle tone.  Skin: Skin is warm and dry. No rash noted.  Psychiatric: She has a normal mood and affect. Her behavior is normal.  Vitals reviewed.  Dg Chest 2 View  Result Date: 03/27/2017 CLINICAL DATA:  Cough for 2 weeks EXAM: CHEST  2 VIEW COMPARISON:  None. FINDINGS: The heart size and mediastinal contours are within normal limits. Both lungs are clear. The visualized skeletal structures are unremarkable. IMPRESSION: No active cardiopulmonary disease. Electronically Signed   By: Gerome Sam III M.D   On: 03/27/2017 12:09    ASSESSMENT & PLAN: Tanya Kennedy was seen today for sore throat and cough.  Diagnoses and all orders for this visit:  Sore throat  Acute pharyngitis, unspecified etiology -     cefUROXime (CEFTIN) 500 MG tablet; Take 1 tablet (500 mg total) by mouth 2 (two) times daily with a meal.  Acute URI  Esophageal dysphagia -     Ambulatory referral to Gastroenterology  Cough -     DG Chest 2 View; Future    Patient Instructions       IF you received an x-ray today, you will receive an invoice from [facility]. Please contact [facility] at [phone] with questions or concerns regarding your invoice.   IF you received  labwork today, you will receive an invoice from [facility]. Please contact [facility] at [phone] with questions or concerns regarding your invoice.   Our billing staff will not be able to assist you with questions regarding bills from these companies.  You will be contacted with the lab results as soon as they are available. The fastest way to get your results is to activate your My Chart account. Instructions are located on the last page of this paperwork. If you have not heard from [place] regarding the results in 2 weeks, please contact this office.     Dysphagia Dysphagia is trouble swallowing. This condition occurs when solids and liquids stick in a person's throat on the way down to the stomach, or when food takes longer to get  to the stomach. You may have problems swallowing food, liquids, or both. You may also have pain while trying to swallow. It may take you more time and effort to swallow something. What are the causes? This condition is caused by:  Problems with the muscles. They may make it difficult for you to move food and liquids through the tube that connects your mouth to your stomach (esophagus). You may have ulcers, scar tissue, or inflammation that blocks the normal passage of food and liquids. Causes of these problems include:  Acid reflux from your stomach into your esophagus (gastroesophageal reflux).  Infections.  Radiation treatment for cancer.  Medicines taken without enough fluids to wash them down into your stomach.  Nerve problems. These prevent signals from being sent to the muscles of your esophagus to squeeze (contract) and move what you swallow down to your stomach.  Globus pharyngeus. This is a common problem that involves feeling like something is stuck in the throat or a sense of trouble with swallowing even though nothing is wrong with the swallowing passages.  Stroke. This can affect the nerves and make it difficult to swallow.  Certain conditions, such  as cerebral palsy or Parkinson disease. What are the signs or symptoms? Common symptoms of this condition include:  A feeling that solids or liquids are stuck in your throat on the way down to the stomach.  Food taking too long to get to the stomach. Other symptoms include:  Food moving back from your stomach to your mouth (regurgitation).  Noises coming from your throat.  Chest discomfort with swallowing.  A feeling of fullness when swallowing.  Drooling, especially when the throat is blocked.  Pain while swallowing.  Heartburn.  Coughing or gagging while trying to swallow. How is this diagnosed? This condition is diagnosed by:  Barium X-ray. In this test, you swallow a white substance (contrast medium)that sticks to the inside of your esophagus. X-ray images are then taken.  Endoscopy. In this test, a flexible telescope is inserted down your throat to look at your esophagus and your stomach.  CT scans and MRI. How is this treated? Treatment for dysphagia depends on the cause of the condition:  If the dysphagia is caused by acid reflux or infection, medicines may be used. They may include antibiotics and heartburn medicines.  If the dysphagia is caused by problems with your muscles, swallowing therapy may be used to help you strengthen your swallowing muscles. You may have to do specific exercises to strengthen the muscles or stretch them.  If the dysphagia is caused by a blockage or mass, procedures to remove the blockage may be done. You may need surgery and a feeding tube. You may need to make diet changes. Ask your health care provider for specific instructions. Follow these instructions at home: Eating and drinking   Try to eat soft food that is easier to swallow.  Follow any diet changes as told by your health care provider.  Cut your food into small pieces and eat slowly.  Eat and drink only when you are sitting upright.  Do not drink alcohol or caffeine.  If you need help quitting, ask your health care provider. General instructions   Check your weight every day to make sure you are not losing weight.  Take over-the-counter and prescription medicines only as told by your health care provider.  If you were prescribed an antibiotic medicine, take it as told by your health care provider. Do not stop taking the  antibiotic even if you start to feel better.  Do not use any products that contain nicotine or tobacco, such as cigarettes and e-cigarettes. If you need help quitting, ask your health care provider.  Keep all follow-up visits as told by your health care provider. This is important. Contact a health care provider if:  You lose weight because you cannot swallow.  You cough when you drink liquids (aspiration).  You cough up partially digested food. Get help right away if:  You cannot swallow your saliva.  You have shortness of breath or a fever, or both.  You have a hoarse voice and also have trouble swallowing. Summary  Dysphagia is trouble swallowing. This condition occurs when solids and liquids stick in a person's throat on the way down to the stomach, or when food takes longer to get to the stomach.  Dysphagia has many possible causes and symptoms.  Treatment for dysphagia depends on the cause of the condition. This information is not intended to replace advice given to you by your health care provider. Make sure you discuss any questions you have with your health care provider. Document Released: 10/25/2000 Document Revised: 10/17/2016 Document Reviewed: 10/17/2016 Elsevier Interactive Patient Education  2017 Elsevier Inc.      Edwina Barth, MD [facility]

## 2017-03-27 NOTE — Patient Instructions (Addendum)
   IF you received an x-ray today, you will receive an invoice from [facility]. Please contact [facility] at [phone] with questions or concerns regarding your invoice.   IF you received labwork today, you will receive an invoice from [facility]. Please contact [facility] at [phone] with questions or concerns regarding your invoice.   Our billing staff will not be able to assist you with questions regarding bills from these companies.  You will be contacted with the lab results as soon as they are available. The fastest way to get your results is to activate your My Chart account. Instructions are located on the last page of this paperwork. If you have not heard from us regarding the results in 2 weeks, please contact this office.      Dysphagia Dysphagia is trouble swallowing. This condition occurs when solids and liquids stick in a person's throat on the way down to the stomach, or when food takes longer to get to the stomach. You may have problems swallowing food, liquids, or both. You may also have pain while trying to swallow. It may take you more time and effort to swallow something. What are the causes? This condition is caused by:  Problems with the muscles. They may make it difficult for you to move food and liquids through the tube that connects your mouth to your stomach (esophagus). You may have ulcers, scar tissue, or inflammation that blocks the normal passage of food and liquids. Causes of these problems include: ? Acid reflux from your stomach into your esophagus (gastroesophageal reflux). ? Infections. ? Radiation treatment for cancer. ? Medicines taken without enough fluids to wash them down into your stomach.  Nerve problems. These prevent signals from being sent to the muscles of your esophagus to squeeze (contract) and move what you swallow down to your stomach.  Globus pharyngeus. This is a common problem that involves feeling like something  is stuck in the throat or a sense of trouble with swallowing even though nothing is wrong with the swallowing passages.  Stroke. This can affect the nerves and make it difficult to swallow.  Certain conditions, such as cerebral palsy or Parkinson disease.  What are the signs or symptoms? Common symptoms of this condition include:  A feeling that solids or liquids are stuck in your throat on the way down to the stomach.  Food taking too long to get to the stomach.  Other symptoms include:  Food moving back from your stomach to your mouth (regurgitation).  Noises coming from your throat.  Chest discomfort with swallowing.  A feeling of fullness when swallowing.  Drooling, especially when the throat is blocked.  Pain while swallowing.  Heartburn.  Coughing or gagging while trying to swallow.  How is this diagnosed? This condition is diagnosed by:  Barium X-ray. In this test, you swallow a white substance (contrast medium)that sticks to the inside of your esophagus. X-ray images are then taken.  Endoscopy. In this test, a flexible telescope is inserted down your throat to look at your esophagus and your stomach.  CT scans and MRI.  How is this treated? Treatment for dysphagia depends on the cause of the condition:  If the dysphagia is caused by acid reflux or infection, medicines may be used. They may include antibiotics and heartburn medicines.  If the dysphagia is caused by problems with your muscles, swallowing therapy may be used to help you strengthen your swallowing muscles. You may have to do specific exercises to   strengthen the muscles or stretch them.  If the dysphagia is caused by a blockage or mass, procedures to remove the blockage may be done. You may need surgery and a feeding tube.  You may need to make diet changes. Ask your health care provider for specific instructions. Follow these instructions at home: Eating and drinking  Try to eat soft food that  is easier to swallow.  Follow any diet changes as told by your health care provider.  Cut your food into small pieces and eat slowly.  Eat and drink only when you are sitting upright.  Do not drink alcohol or caffeine. If you need help quitting, ask your health care provider. General instructions  Check your weight every day to make sure you are not losing weight.  Take over-the-counter and prescription medicines only as told by your health care provider.  If you were prescribed an antibiotic medicine, take it as told by your health care provider. Do not stop taking the antibiotic even if you start to feel better.  Do not use any products that contain nicotine or tobacco, such as cigarettes and e-cigarettes. If you need help quitting, ask your health care provider.  Keep all follow-up visits as told by your health care provider. This is important. Contact a health care provider if:  You lose weight because you cannot swallow.  You cough when you drink liquids (aspiration).  You cough up partially digested food. Get help right away if:  You cannot swallow your saliva.  You have shortness of breath or a fever, or both.  You have a hoarse voice and also have trouble swallowing. Summary  Dysphagia is trouble swallowing. This condition occurs when solids and liquids stick in a person's throat on the way down to the stomach, or when food takes longer to get to the stomach.  Dysphagia has many possible causes and symptoms.  Treatment for dysphagia depends on the cause of the condition. This information is not intended to replace advice given to you by your health care provider. Make sure you discuss any questions you have with your health care provider. Document Released: 10/25/2000 Document Revised: 10/17/2016 Document Reviewed: 10/17/2016 Elsevier Interactive Patient Education  2017 Elsevier Inc.  

## 2017-04-25 ENCOUNTER — Encounter: Payer: Self-pay | Admitting: Gastroenterology

## 2017-04-25 ENCOUNTER — Ambulatory Visit (INDEPENDENT_AMBULATORY_CARE_PROVIDER_SITE_OTHER): Payer: Managed Care, Other (non HMO) | Admitting: Gastroenterology

## 2017-04-25 VITALS — BP 128/86 | HR 76 | Ht 63.25 in | Wt 134.4 lb

## 2017-04-25 DIAGNOSIS — R1310 Dysphagia, unspecified: Secondary | ICD-10-CM | POA: Diagnosis not present

## 2017-04-25 NOTE — Patient Instructions (Signed)
If you are age 62 or older, your body mass index should be between 23-30. Your Body mass index is 23.62 kg/m. If this is out of the aforementioned range listed, please consider follow up with your Primary Care Provider.  If you are age 62 or younger, your body mass index should be between 19-25. Your Body mass index is 23.62 kg/m. If this is out of the aformentioned range listed, please consider follow up with your Primary Care Provider.   You have been scheduled for an endoscopy. Please follow written instructions given to you at your visit today. If you use inhalers (even only as needed), please bring them with you on the day of your procedure. Your physician has requested that you go to www.startemmi.com and enter the access code given to you at your visit today. This web site gives a general overview about your procedure. However, you should still follow specific instructions given to you by our office regarding your preparation for the procedure.  Thank you.

## 2017-04-25 NOTE — Progress Notes (Signed)
HPI :  61 y/o female here for a follow up visit for symptoms of dysphagia.  She reports symptoms of dysphagia for a few years, mild over time, perhaps getting worse more recently. She occasionally feels food getting stuck with swallows. She has been trying to eat slower and take smaller bites which has helped to minimize symptoms. She was given a trial of prilosec by her primary care but she hasn't taken it. Dysphagia occurs mainly with meats if she doesn't chew it well. She feels it in the sternal notch, or some discomfort in her lower chest. No dysphagia to liquids. She drinks water to help push food down. No nausea or vomiting. She denies much hearbturn or regurgitation recently. No postrpandial pain.   No FH of esophageal cancer. Mother had cancer of unknown primary. Father had colon cancer.   No prior upper endoscopy.  Colonoscopy 11/08/10 by Dr. Kinnie Scales - sigmoid diverticulosis, internal hemorrhoids, otherwise normal without colon polyps, good bowel prep noted. Colonoscopy 07/05/2005 by Dr. Arlyce Dice, signoid diverticulosis, otherwise normal, no polyps    Past Medical History:  Diagnosis Date  . Abnormal finding on Pap smear, ASCUS   . Allergy   . Colon polyps   . Diverticulosis   . HBP (high blood pressure)   . Hypoestrogenism   . Nocturia   . OAB (overactive bladder)    h/o  . Osteopenia   . Osteoporosis   . Scoliosis      Past Surgical History:  Procedure Laterality Date  . COLONOSCOPY    . WISDOM TOOTH EXTRACTION     Family History  Problem Relation Age of Onset  . Colon cancer Father   . Hypertension Mother   . Cancer Mother        type unknown-mets   Social History  Substance Use Topics  . Smoking status: Former Smoker    Types: Cigarettes    Quit date: 11/11/1981  . Smokeless tobacco: Never Used  . Alcohol use 0.0 oz/week     Comment: social   Current Outpatient Prescriptions  Medication Sig Dispense Refill  . calcium carbonate 200 MG capsule Take 250 mg  by mouth 2 (two) times daily with a meal.    . citalopram (CELEXA) 20 MG tablet Take 20 mg by mouth daily.     . clonazePAM (KLONOPIN) 0.5 MG tablet Take 0.5 mg by mouth as needed. Pt takes 1/2 tablet as needed for sleep    . lisinopril-hydrochlorothiazide (PRINZIDE,ZESTORETIC) 10-12.5 MG per tablet Take 1 tablet by mouth daily.    . Multiple Vitamin (MULTIVITAMIN) tablet Take 1 tablet by mouth daily.     No current facility-administered medications for this visit.    No Known Allergies   Review of Systems: All systems reviewed and negative except where noted in HPI.    Dg Chest 2 View  Result Date: 03/27/2017 CLINICAL DATA:  Cough for 2 weeks EXAM: CHEST  2 VIEW COMPARISON:  None. FINDINGS: The heart size and mediastinal contours are within normal limits. Both lungs are clear. The visualized skeletal structures are unremarkable. IMPRESSION: No active cardiopulmonary disease. Electronically Signed   By: Gerome Sam III M.D   On: 03/27/2017 12:09    Physical Exam: BP 128/86 (BP Location: Left Arm, Patient Position: Sitting, Cuff Size: Normal)   Pulse 76   Ht 5' 3.25" (1.607 m)   Wt 134 lb 6 oz (61 kg)   LMP 11/11/1996   BMI 23.62 kg/m  Constitutional: Pleasant,well-developed, female in no acute  distress. HEENT: Normocephalic and atraumatic. Conjunctivae are normal. No scleral icterus. Neck supple.  Cardiovascular: Normal rate, regular rhythm.  Pulmonary/chest: Effort normal and breath sounds normal. No wheezing, rales or rhonchi. Abdominal: Soft, nondistended, nontender. There are no masses palpable. No hepatomegaly. Extremities: no edema Lymphadenopathy: No cervical adenopathy noted. Neurological: Alert and oriented to person place and time. Skin: Skin is warm and dry. No rashes noted. Psychiatric: Normal mood and affect. Behavior is normal.   ASSESSMENT AND PLAN: 61 year old female here for the following issues:  Dysphagia - as outlined above. Suspect this could be  due to a Schatzki ring, but discussed fold differential with her. Recommend upper endoscopy to further evaluate and dilate as needed. I discussed risks and benefits of upper endoscopy with her and she wished proceed. She wished to hold off on empiric trial of PPI until that time given she has no symptoms of heartburn which is reasonable. I asked her to chew food very well and eats small bites to prevent symptoms moving forward until endoscopy is done.  Colon cancer screening - father had colon cancer at age 6270s, last 2 colonoscopies were normal without polyps. Asymptomatic. She is next due for colon cancer screening in 2021.  Ileene PatrickSteven Kyrin Gratz, MD [facility] Pager [phone]

## 2017-04-28 ENCOUNTER — Encounter: Payer: Managed Care, Other (non HMO) | Admitting: Gastroenterology

## 2017-05-05 ENCOUNTER — Encounter: Payer: Self-pay | Admitting: Gastroenterology

## 2017-05-19 ENCOUNTER — Ambulatory Visit (AMBULATORY_SURGERY_CENTER): Payer: Managed Care, Other (non HMO) | Admitting: Gastroenterology

## 2017-05-19 ENCOUNTER — Encounter: Payer: Self-pay | Admitting: Gastroenterology

## 2017-05-19 VITALS — BP 112/72 | HR 51 | Temp 98.4°F | Resp 9 | Ht 63.25 in | Wt 134.0 lb

## 2017-05-19 DIAGNOSIS — K222 Esophageal obstruction: Secondary | ICD-10-CM | POA: Diagnosis not present

## 2017-05-19 DIAGNOSIS — R1310 Dysphagia, unspecified: Secondary | ICD-10-CM

## 2017-05-19 MED ORDER — OMEPRAZOLE 20 MG PO CPDR: 20.0000 mg | DELAYED_RELEASE_CAPSULE | Freq: Every day | ORAL | 3 refills | Status: AC

## 2017-05-19 MED ORDER — SODIUM CHLORIDE 0.9 % IV SOLN: 500.0000 mL | INTRAVENOUS | Status: AC

## 2017-05-19 NOTE — Progress Notes (Signed)
Called to room to assist during endoscopic procedure.  Patient ID and intended procedure confirmed with present staff. Received instructions for my participation in the procedure from the performing physician.  

## 2017-05-19 NOTE — Progress Notes (Signed)
No problems noted in the recovery room. maw 

## 2017-05-19 NOTE — Patient Instructions (Signed)
YOU HAD AN ENDOSCOPIC PROCEDURE TODAY AT THE [facility]:   Refer to the procedure report that was given to you for any specific questions about what was found during the examination.  If the procedure report does not answer your questions, please call your gastroenterologist to clarify.  If you requested that your care partner not be given the details of your procedure findings, then the procedure report has been included in a sealed envelope for you to review at your convenience later.  YOU SHOULD EXPECT: Some feelings of bloating in the abdomen. Passage of more gas than usual.  Walking can help get rid of the air that was put into your GI tract during the procedure and reduce the bloating. If you had a lower endoscopy (such as a colonoscopy or flexible sigmoidoscopy) you may notice spotting of blood in your stool or on the toilet paper. If you underwent a bowel prep for your procedure, you may not have a normal bowel movement for a few days.  Please Note:  You might notice some irritation and congestion in your nose or some drainage.  This is from the oxygen used during your procedure.  There is no need for concern and it should clear up in a day or so.  SYMPTOMS TO REPORT IMMEDIATELY:    Following upper endoscopy (EGD)  Vomiting of blood or coffee ground material  New chest pain or pain under the shoulder blades  Painful or persistently difficult swallowing  New shortness of breath  Fever of 100F or higher  Black, tarry-looking stools  For urgent or emergent issues, a gastroenterologist can be reached at any hour by calling [phone].   DIET: Please follow the esophageal dilatation diet the rest of today.  Handout was given to your care partner. Drink plenty of fluids but you should avoid alcoholic beverages for 24 hours.  ACTIVITY:  You should plan to take it easy for the rest of today and you should NOT DRIVE or use heavy machinery until tomorrow (because of the  sedation medicines used during the test).    FOLLOW UP: Our staff will call the number listed on your records the next business day following your procedure to check on you and address any questions or concerns that you may have regarding the information given to you following your procedure. If we do not reach you, we will leave a message.  However, if you are feeling well and you are not experiencing any problems, there is no need to return our call.  We will assume that you have returned to your regular daily activities without incident.  If any biopsies were taken you will be contacted by phone or by letter within the next 1-3 weeks.  Please call us at [phone] if you have not heard about the biopsies in 3 weeks.    SIGNATURES/CONFIDENTIALITY: You and/or your care partner have signed paperwork which will be entered into your electronic medical record.  These signatures attest to the fact that that the information above on your After Visit Summary has been reviewed and is understood.  Full responsibility of the confidentiality of this discharge information lies with you and/or your care-partner.    Handouts were given to your care partner on a hiatal hernia, esophagitis with stricture, and the post esophageal stricture diet to follow the rest of today. You may resume your current medications today. Adding OMEPRAZOLE 20 mg daily # 90 refill x2 sent to your pharmacy. Please call if  any questions or concerns.   

## 2017-05-19 NOTE — Progress Notes (Signed)
Dental advisory given to patient 

## 2017-05-19 NOTE — Op Note (Signed)
[facility] Patient Name: Tanya Kennedy Procedure Date: 05/19/2017 11:04 AM MRN: [id number] Endoscopist: Viviann Spare P. Ciin Brazzel MD, MD Age: 61 Referring MD:  Date of Birth: 1956/01/04 Gender: Female Account #: [id number] Procedure:                Upper GI endoscopy Indications:              Dysphagia Medicines:                Monitored Anesthesia Care Procedure:                Pre-Anesthesia Assessment:                           - Prior to the procedure, a History and Physical                            was performed, and patient medications and                            allergies were reviewed. The patient's tolerance of                            previous anesthesia was also reviewed. The risks                            and benefits of the procedure and the sedation                            options and risks were discussed with the patient.                            All questions were answered, and informed consent                            was obtained. Prior Anticoagulants: The patient has                            taken no previous anticoagulant or antiplatelet                            agents. ASA Grade Assessment: II - A patient with                            mild systemic disease. After reviewing the risks                            and benefits, the patient was deemed in                            satisfactory condition to undergo the procedure.                           After obtaining informed consent, the endoscope was  passed under direct vision. Throughout the                            procedure, the patient's blood pressure, pulse, and                            oxygen saturations were monitored continuously. The                            Model GIF-HQ190 [id number]) scope was introduced                            through the mouth, and advanced to the second part                            of duodenum. The upper GI endoscopy  was                            accomplished without difficulty. The patient                            tolerated the procedure well. Scope In: Scope Out: Findings:                 Esophagogastric landmarks were identified: the                            Z-line was found at 31 cm, the gastroesophageal                            junction was found at 31 cm and the upper extent of                            the gastric folds was found at 35 cm from the                            incisors.                           A 4 cm hiatal hernia was present.                           One moderate (circumferential scarring or stenosis;                            an endoscope may pass) benign-appearing, intrinsic                            stenosis was found 31 cm from the incisors, in                            association with mild stenosis. This measured less                            than  one cm (in length) and was traversed. A TTS                            dilator was passed through the scope. Dilation with                            a 16-17-18 mm balloon dilator was performed to 17                            mm at which point an appropriate mucosal wrent was                            noted.                           The exam of the esophagus was otherwise normal.                           The entire examined stomach was normal.                           The duodenal bulb and second portion of the                            duodenum were normal. Complications:            No immediate complications. Estimated blood loss:                            Minimal. Estimated Blood Loss:     Estimated blood loss was minimal. Impression:               - Esophagogastric landmarks identified.                           - 4 cm hiatal hernia.                           - Benign-appearing esophageal stenosis. Dilated to                            17mm with good response                           - Mild  esophagitis.                           - Normal stomach.                           - Normal duodenal bulb and second portion of the                            duodenum.                           - No specimens collected. Recommendation:           -  Patient has a contact number available for                            emergencies. The signs and symptoms of potential                            delayed complications were discussed with the                            patient. Return to normal activities tomorrow.                            Written discharge instructions were provided to the                            patient.                           - Resume previous diet.                           - Continue present medications.                           - Recommend omeprazole 20mg  once daily to treat                            esophagitis and prevent recurrent stricturing                           - Repeat upper endoscopy PRN for retreatment. Viviann Spare P. Colbi Schiltz MD, MD 05/19/2017 11:29:11 AM This report has been signed electronically.

## 2017-05-19 NOTE — Progress Notes (Signed)
Pt. Reports no change in surgical or medical history since pre-visit 04/25/2017.

## 2017-05-19 NOTE — Progress Notes (Signed)
A/ox3 pleased with MAC, report to [facility]

## 2017-05-20 ENCOUNTER — Telehealth: Payer: Self-pay

## 2017-05-20 NOTE — Telephone Encounter (Signed)
Unable to leave message. Person has blocked this number.

## 2017-05-20 NOTE — Telephone Encounter (Signed)
  Follow up Call-  Call back number 05/19/2017  Post procedure Call Back phone  # [phone]  Permission to leave phone message Yes  Some recent data might be hidden    Unable to leave message number is blocked.
